# Patient Record
Sex: Male | Born: 2000 | Race: Black or African American | Hispanic: No | Marital: Single | Smoking: Never smoker
Health system: Southern US, Community
[De-identification: ages and names within clinical notes are randomized; demographics above are authoritative.]

## PROBLEM LIST (undated history)

## (undated) DIAGNOSIS — J45909 Unspecified asthma, uncomplicated: Secondary | ICD-10-CM

## (undated) HISTORY — DX: Unspecified asthma, uncomplicated: J45.909

---
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
## 2018-01-30 ENCOUNTER — Ambulatory Visit: Payer: Medicaid Other | Admitting: Pediatrics

## 2018-07-17 ENCOUNTER — Telehealth: Payer: Self-pay | Admitting: Pediatrics

## 2018-07-17 NOTE — Telephone Encounter (Signed)
Left VM at the primary number in the chart regarding prescreening questions. ° °

## 2018-07-18 ENCOUNTER — Ambulatory Visit (INDEPENDENT_AMBULATORY_CARE_PROVIDER_SITE_OTHER): Payer: Medicaid Other | Admitting: Pediatrics

## 2018-07-18 ENCOUNTER — Other Ambulatory Visit: Payer: Self-pay

## 2018-07-18 ENCOUNTER — Encounter: Payer: Self-pay | Admitting: Pediatrics

## 2018-07-18 VITALS — BP 118/78 | HR 78 | Ht 70.75 in | Wt 315.2 lb

## 2018-07-18 DIAGNOSIS — Z0001 Encounter for general adult medical examination with abnormal findings: Secondary | ICD-10-CM | POA: Diagnosis not present

## 2018-07-18 DIAGNOSIS — F902 Attention-deficit hyperactivity disorder, combined type: Secondary | ICD-10-CM

## 2018-07-18 DIAGNOSIS — Z113 Encounter for screening for infections with a predominantly sexual mode of transmission: Secondary | ICD-10-CM

## 2018-07-18 DIAGNOSIS — Z68.41 Body mass index (BMI) pediatric, greater than or equal to 95th percentile for age: Secondary | ICD-10-CM

## 2018-07-18 MED ORDER — VYVANSE 70 MG PO CAPS: 70.0000 mg | ORAL_CAPSULE | Freq: Every day | ORAL | 0 refills | Status: DC

## 2018-07-18 NOTE — Progress Notes (Signed)
Subjective:     Gary Shields, is a 18 y.o. male  HPI  Chief Complaint  Patient presents with  . Follow-up    ADHD   Also due for well exam initiated, but incomplete screening as mother arrived and reported he is late for  Dentist appt.  Requested and did not review ARS-ADHD  ROS/ Side effects? No change in appetite,--ify with mess, if not eat, eats too much when wears off No change in sleep--no proble No Headache or dizziness--only not eat breakfast , didn't eat lunch got a head ache No chest pain or palpitations--no No nausea, vomiting or abd pain No change in mood, no elation, no wearing off dysphoria--get agitated if meds wears off  Recent ADHD visits  11/2015, 12/2015, 01/2016, 08/2017 --last seen for this  03/2017-last well visit  Graduated from [employer]  To go to [employer] for 2 sem, then to [employer] in [place] and business: first a food truck, then [employer]  Working at [employer], then laid off and now food [employer] Would like to work out Dad is on a serious diet now (2 weeks) making him do it too.  Pt had gained 25 lb in last one year  Before was run and basketball--no more exercise  k and  Lives with mom, dad, 4 sisters One moved in--to start her own business  No asthma symptoms send early High school  Much less anxious and worried--figuring out the future  No drink No smoke No relationships  Too muh time on media  Wants to start on meds  Has a few pill Didn't really use them over the last school year    Grades were good; didn't pass one class--it was credit recovery Never got back in touch with him everthing else was A and B 11th grade was hardest year  No drinking, no tobacco,  Tried marijuana and didn't like that it triggered his asthma   Review of Systems   The following portions of the patient's history were reviewed and updated as appropriate: allergies, current medications, past family history, past  medical history, past social history, past surgical history and problem list.  History and Problem List: Gary Shields has Moderate persistent asthma without complication; Acanthosis nigricans; Adjustment disorder of adolescence; Obesity, pediatric, BMI 95th to 98th percentile for age; and Attention deficit hyperactivity disorder (ADHD) on their problem list.  Gary Shields  has a past medical history of Asthma.     Objective:     BP 118/78 (BP Location: Right Arm, Patient Position: Sitting, Cuff Size: Large)   Pulse 78   Ht 5' 10.75" (1.797 m)   Wt (!) 315 lb 3.2 oz (143 kg)   SpO2 99%   BMI 44.27 kg/m   Physical Exam Constitutional:      General: He is not in acute distress.    Appearance: He is well-developed.  HENT:     Head: Normocephalic and atraumatic.     Nose: Nose normal.  Eyes:     General:        Right eye: No discharge.        Left eye: No discharge.     Conjunctiva/sclera: Conjunctivae normal.  Neck:     Musculoskeletal: Normal range of motion.     Thyroid: No thyromegaly.  Cardiovascular:     Rate and Rhythm: Normal rate and regular rhythm.     Heart sounds: Normal heart sounds. No murmur.  Pulmonary:  Effort: No respiratory distress.     Breath sounds: No wheezing or rales.  Abdominal:     General: There is no distension.     Palpations: Abdomen is soft.     Tenderness: There is no abdominal tenderness.  Lymphadenopathy:     Cervical: No cervical adenopathy.  Skin:    General: Skin is warm and dry.     Findings: Rash present.     Comments: acanthosis     Assessment & Plan:   1. Encounter for general adult medical examination with abnormal findings  2. Attention deficit hyperactivity disorder (ADHD), combined type  Will need for restart before college starts To start about 7-10 days before classes in mid august  - VYVANSE 70 MG capsule; Take 1 capsule (70 mg total) by mouth daily.  Dispense: 30 capsule; Refill: 0 - VYVANSE 70 MG capsule; Take 1 capsule  (70 mg total) by mouth daily.  Dispense: 30 capsule; Refill: 0  Requested, and did not review ARS  3. Routine screening for STI (sexually transmitted infection) No collected - C. trachomatis/N. gonorrhoeae RNA - POCT Rapid HIV  4. BMI (body mass index), pediatric, > 99% for age To make appt to complete - Lipid panel - VITAMIN D 25 Hydroxy (Vit-D Deficiency, Fractures) - Hemoglobin A1c   Supportive care and return precautions reviewed.  Spent  30  minutes face to face time with patient; greater than 50% spent in counseling regarding diagnosis and treatment plan.   Theadore NanHilary Kynnadi Dicenso, MD

## 2019-02-03 ENCOUNTER — Telehealth: Payer: Self-pay | Admitting: Pediatrics

## 2019-02-03 NOTE — Telephone Encounter (Signed)

## 2019-02-04 ENCOUNTER — Encounter: Payer: Self-pay | Admitting: Pediatrics

## 2019-02-04 ENCOUNTER — Other Ambulatory Visit (HOSPITAL_COMMUNITY)
Admission: RE | Admit: 2019-02-04 | Discharge: 2019-02-04 | Disposition: A | Discharge status: Home / Self Care | Payer: Medicaid Other | Source: Ambulatory Visit | Attending: Pediatrics | Admitting: Pediatrics

## 2019-02-04 ENCOUNTER — Other Ambulatory Visit: Payer: Self-pay

## 2019-02-04 ENCOUNTER — Ambulatory Visit (INDEPENDENT_AMBULATORY_CARE_PROVIDER_SITE_OTHER): Payer: Medicaid Other | Admitting: Pediatrics

## 2019-02-04 VITALS — BP 126/78 | HR 88 | Ht 69.61 in | Wt 312.8 lb

## 2019-02-04 DIAGNOSIS — Z113 Encounter for screening for infections with a predominantly sexual mode of transmission: Secondary | ICD-10-CM

## 2019-02-04 DIAGNOSIS — Z0001 Encounter for general adult medical examination with abnormal findings: Secondary | ICD-10-CM

## 2019-02-04 DIAGNOSIS — Z0000 Encounter for general adult medical examination without abnormal findings: Secondary | ICD-10-CM | POA: Diagnosis not present

## 2019-02-04 DIAGNOSIS — F902 Attention-deficit hyperactivity disorder, combined type: Secondary | ICD-10-CM

## 2019-02-04 LAB — POCT RAPID HIV: Rapid HIV, POC: NEGATIVE

## 2019-02-04 MED ORDER — VYVANSE 70 MG PO CAPS: 70.0000 mg | ORAL_CAPSULE | Freq: Every day | ORAL | 0 refills | Status: AC

## 2019-02-04 NOTE — Patient Instructions (Signed)
  As your medical provider, it is important to me that you continue to receive high-quality primary care services as you transition to adulthood.  We can begin the process of your transition to an adult provider as early as age 21.  We would like to help this process be stream lined and smooth for you as you leave the care of our pediatric practice.   After the age of 21 (and before your 22nd birthday), you can no longer be seen at the [facility] for your primary care health services. We are working with you to assure your understanding of your healthcare needs, medications and any referrals that have be put in place for your care.    We have a list of adult medicine/family practices that you and your pediatric primary care provider will review with you to determine which practice may be the best fit for your needs      [facility]     Address: [address] [place], [place] [address] Phone: [phone]        Office Manager: Susy Manor Solis Hours: Monday - Friday 9 AM -6 PM       [facility]       Address: [address] [place], [place] [address] Phone: [phone]         Office Manager: Susy Manor Solis Hours: Monday - Friday 8:30 AM - 5 PM       [facility]    Address: [address] [place], [place] [address] Phone:  [phone]       Office Manager: Susy Manor Solis Hours:  Monday - Friday 8:30 am - 5:30 pm       [facility]                       Address: [address] [place], [place] [address]           Phone: [phone]              Office Manager:  Ree Shay                       Hours: Monday - Friday 8:30 AM - 5 PM      [facility] Located on the ground floor of [facility] Address: [address]  [place],  [place]  [address] Phone: [phone]             Office  Manager:  Gentry Fitz Hours: Monday - Friday 8:15 AM - 5 PM

## 2019-02-04 NOTE — Progress Notes (Signed)
Adolescent Well Care Visit Gary Shields is a 19 y.o. male who is here for well care and ADHD FU    PCP:  Theadore Nan, MD   History was provided by the patient.  Current Issues: Current concerns include  Last ADHD visit 07/2018  `  Last well exam 03/2017  Currently living with mom,dad and sister--29, 4 sister taking english and math at [employer] with mom Did ok for grades last semester  Didn't finish all courses/ needs better grades so is to delay start at 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 Sleeping  Sleeps 6 hr a night Goes to work at 4 am Appetite--not hungry on meds No Headache or dizziness No chest pain or palpitations No nausea, vomiting or abd pain No change in mood, no elation, no wearing off dysphoria  Obesity Not trying now to diet as he was in the fall Family to buy gym equipement Diet -dad--on and off--to restart , patient may join him Wants to restart at Y--or more exercise  Marijuana--every 2 week  Uses Edible to avoid smoke Alcohol-no Not sexually active/ not dating anyone-pandemic makes it hard  No asthma--   Dentist--last 6 months ago  Screenings: ASRS completed, positive entered results in [facility] comprephensive screening tab    Physical Exam:  Vitals:   02/04/19 1139  BP: 126/78  Pulse: 88  SpO2: 99%  Weight: (!) 312 lb 12.8 oz (141.9 kg)  Height: 5' 9.61" (1.768 m)   BP 126/78 (BP Location: Right Arm, Patient Position: Sitting)   Pulse 88   Ht 5' 9.61" (1.768 m)   Wt (!) 312 lb 12.8 oz (141.9 kg)   SpO2 99%   BMI 45.39 kg/m  Body mass index: body mass index is 45.39 kg/m. Growth percentile SmartLinks can only be used for patients less  than 75 years old.   Hearing Screening   125Hz  250Hz  500Hz  1000Hz  2000Hz  3000Hz  4000Hz  6000Hz  8000Hz   Right ear:   20 20 20  20     Left ear:   20 20 20  20       Visual Acuity Screening   Right eye Left eye Both eyes  Without correction:     With correction: 20/20 20/20 20/20   Comments: With glasses   General Appearance:   alert, oriented, no acute distress and obese  HENT: Normocephalic, no obvious abnormality, conjunctiva clear  Mouth:   Normal appearing teeth, no obvious discoloration, dental caries, or dental caps  Neck:   Supple; thyroid: no enlargement, symmetric, no tenderness/mass/nodules  Chest Normal male  Lungs:   Clear to auscultation bilaterally, normal work of breathing  Heart:   Regular rate and rhythm, S1 and S2 normal, no murmurs;   Abdomen:   Soft, non-tender, no mass, or organomegaly  GU normal male genitals, no testicular masses or hernia  Musculoskeletal:   Tone and strength strong and symmetrical, all extremities               Lymphatic:   No cervical adenopathy  Skin/Hair/Nails:   Skin warm, dry and intact,, no bruises or petechiae, acanthosis present  Neurologic:   Strength, gait, and coordination  normal and age-appropriate     Assessment and Plan:   1. Encounter for general adult medical examination with abnormal findings   2. Routine screening for STI (sexually transmitted infection)  - Urine cytology ancillary only - POCT Rapid HIV-neg  3. Class 3 severe obesity with body mass index (BMI) of 45.0 to 49.9 in adult, unspecified obesity type, unspecified whether serious comorbidity present ([place])  Somewhat motivated to restart diet improvements and exercise Screening for acanthosis and positive family hx of diabetes  - Hemoglobin A1c - Lipid panel - VITAMIN D 25 Hydroxy (Vit-D Deficiency, Fractures)  4. Attention deficit hyperactivity disorder (ADHD), combined type  Strong symptoms without medicine on ASRS Doesn't need afternoon dose  He was  disappointed in academic performance  - VYVANSE 70 MG capsule; Take 1 capsule (70 mg total) by mouth daily.  Dispense: 30 capsule; Refill: 0 - VYVANSE 70 MG capsule; Take 1 capsule (70 mg total) by mouth daily for 27 days.  Dispense: 27 capsule; Refill: 0 - VYVANSE 70 MG capsule; Take 1 capsule (70 mg total) by mouth daily.  Dispense: 30 capsule; Refill: 0   BMI is not appropriate for age  Hearing screening result:normal Vision screening result: normal   Roselind Messier, MD

## 2019-02-05 LAB — HEMOGLOBIN A1C
Hgb A1c MFr Bld: 5.5 % of total Hgb (ref <5.7)
Mean Plasma Glucose: 111 (calc)
eAG (mmol/L): 6.2 (calc)

## 2019-02-05 LAB — URINE CYTOLOGY ANCILLARY ONLY
Chlamydia: NEGATIVE
Comment: NEGATIVE
Comment: NORMAL
Neisseria Gonorrhea: NEGATIVE

## 2019-02-05 LAB — VITAMIN D 25 HYDROXY (VIT D DEFICIENCY, FRACTURES): Vit D, 25-Hydroxy: 9 ng/mL — ABNORMAL LOW (ref 30–100)

## 2019-02-05 LAB — LIPID PANEL
Cholesterol: 177 mg/dL (ref <200)
HDL: 36 mg/dL — ABNORMAL LOW (ref >=40)
LDL Cholesterol (Calc): 125 mg/dL (calc) — ABNORMAL HIGH
Non-HDL Cholesterol (Calc): 141 mg/dL (calc) — ABNORMAL HIGH (ref <130)
Total CHOL/HDL Ratio: 4.9 (calc) (ref <5.0)
Triglycerides: 68 mg/dL (ref <150)

## 2021-10-16 ENCOUNTER — Emergency Department (HOSPITAL_COMMUNITY)
Admission: EM | Admit: 2021-10-16 | Discharge: 2021-10-17 | Disposition: A | Discharge status: Home / Self Care | Payer: BLUE CROSS/BLUE SHIELD | Attending: Emergency Medicine | Admitting: Emergency Medicine

## 2021-10-16 ENCOUNTER — Encounter (HOSPITAL_COMMUNITY): Payer: Self-pay | Admitting: Emergency Medicine

## 2021-10-16 DIAGNOSIS — Z23 Encounter for immunization: Secondary | ICD-10-CM | POA: Diagnosis not present

## 2021-10-16 DIAGNOSIS — S91114A Laceration without foreign body of right lesser toe(s) without damage to nail, initial encounter: Secondary | ICD-10-CM | POA: Diagnosis not present

## 2021-10-16 DIAGNOSIS — S99921A Unspecified injury of right foot, initial encounter: Secondary | ICD-10-CM | POA: Diagnosis present

## 2021-10-16 DIAGNOSIS — W232XXA Caught, crushed, jammed or pinched between a moving and stationary object, initial encounter: Secondary | ICD-10-CM | POA: Diagnosis not present

## 2021-10-16 NOTE — ED Triage Notes (Signed)
Pt here from home with c/o lac to the underneath of he little toe on the right foot , bleeding is controlled , tdap up to date

## 2021-10-17 MED ORDER — TETANUS-DIPHTH-ACELL PERTUSSIS 5-2.5-18.5 LF-MCG/0.5 IM SUSY
0.5000 mL | PREFILLED_SYRINGE | Freq: Once | INTRAMUSCULAR | Status: AC
  Administered 2021-10-17: 0.5 mL via INTRAMUSCULAR
  Filled 2021-10-17: qty 0.5

## 2021-10-17 MED ORDER — LIDOCAINE HCL (PF) 1 % IJ SOLN
5.0000 mL | Freq: Once | INTRAMUSCULAR | Status: AC
  Administered 2021-10-17: 5 mL
  Filled 2021-10-17: qty 30

## 2021-10-17 MED ORDER — DOXYCYCLINE HYCLATE 100 MG PO CAPS: 100.0000 mg | ORAL_CAPSULE | Freq: Two times a day (BID) | ORAL | 0 refills | Status: AC

## 2021-10-17 NOTE — ED Notes (Signed)
Patient has cut on the bottom of his right foot at the small toe. Pt is unsure of last tetanus shot. Pt alert and oriented cooperative with nursing care.

## 2021-10-17 NOTE — Discharge Instructions (Addendum)
It was a pleasure taking care of you today!   You may return to urgent care or return to the emergency department for suture removal in 12-14 days.  Your tetanus was updated in the ED. Keep the area clean and dry. You will be sent an antibiotic, doxycycline, take as directed.  Return to the emergency department if worsening or persistent pain, drainage of wound, increased swelling, or color change to area.

## 2021-10-17 NOTE — ED Notes (Signed)
Pt refused discharge vitals. Rn wraped toe with nonstick dressing then gauze roll and coban. RN educated pt on care of stitches. Pt educated on prescription. Pt ambulated from ED with steady gait.

## 2021-10-17 NOTE — ED Provider Notes (Signed)
[facility]-EMERGENCY DEPT Provider Note   CSN: [id number] Arrival date & time: 10/16/21  2200     History  Chief Complaint  Patient presents with   Foot Injury    Gary Shields is Shields 21 y.o. male who presents to the ED with concerns for right foot injury onset PTA.  Patient notes that he got his right small toe caught in Shields dog cage.  He notes that he was barefoot at the time.  Denies anticoagulants at this time.  Unsure of his tetanus status.  No meds tried prior to arrival.  Denies history of diabetes.  No known drug allergies.  No fever, redness, drainage.  The history is provided by the patient. No language interpreter was used.       Home Medications Prior to Admission medications   Medication Sig Start Date End Date Taking? Authorizing Provider  doxycycline (VIBRAMYCIN) 100 MG capsule Take 1 capsule (100 mg total) by mouth 2 (two) times daily for 5 days. 10/17/21 10/22/21 Yes Gary Luviano A, PA-C  albuterol (PROVENTIL HFA;VENTOLIN HFA) 108 (90 Base) MCG/ACT inhaler Inhale 2 puffs into the lungs every 4 (four) hours as needed for wheezing or shortness of breath. 08/16/17 09/15/17  Stryffeler, Gary Jordan, NP  VYVANSE 70 MG capsule Take 1 capsule (70 mg total) by mouth daily. 04/04/19 05/05/19  Gary Nan, MD  VYVANSE 70 MG capsule Take 1 capsule (70 mg total) by mouth daily for 27 days. 03/07/19 04/03/19  Gary Nan, MD  VYVANSE 70 MG capsule Take 1 capsule (70 mg total) by mouth daily. 02/04/19 03/07/19  Gary Nan, MD      Allergies    Patient has no known allergies.    Review of Systems   Review of Systems  Constitutional:  Negative for fever.  Musculoskeletal:  Negative for arthralgias and joint swelling.  Skin:  Positive for wound. Negative for color change.  All other systems reviewed and are negative.   Physical Exam Updated Vital Signs BP (!) 157/75   Pulse 72   Temp 98.2 F (36.8 C) (Oral)   Resp 18   SpO2 97%   Physical Exam Vitals and nursing note reviewed.  Constitutional:      General: He is not in acute distress.    Appearance: Normal appearance. He is not ill-appearing.  HENT:     Head: Normocephalic and atraumatic.     Right Ear: External ear normal.     Left Ear: External ear normal.  Eyes:     General: No scleral icterus. Cardiovascular:     Rate and Rhythm: Normal rate.  Pulmonary:     Effort: Pulmonary effort is normal.  Musculoskeletal:        General: Normal range of motion.     Cervical back: Normal range of motion and neck supple.     Comments: Full flexion and extension against resistance of right fifth toe.  Pedal pulses intact bilaterally.  Skin:    General: Skin is warm and dry.     Capillary Refill: Capillary refill takes less than 2 seconds.     Findings: Laceration present.     Comments: 3 cm laceration noted to plantar aspect of right fifth toe at the base.   Neurological:     Mental Status: He is alert.     ED Results / Procedures / Treatments   Labs (all labs ordered are listed, but only abnormal results are displayed) Labs Reviewed - No data to display  EKG None  Radiology No results found.  Procedures .Marland KitchenLaceration Repair  Date/Time: 10/17/2021 6:51 AM  Performed by: Gary Colder A, PA-C Authorized by: Gary Settle, PA-C   Consent:    Consent obtained:  Verbal   Consent given by:  Patient   Risks discussed:  Infection, pain and need for additional repair Universal protocol:    Patient identity confirmed:  Verbally with patient and hospital-assigned identification number Anesthesia:    Anesthesia method:  Local infiltration   Local anesthetic:  Lidocaine 1% w/o epi Laceration details:    Location:  Foot   Foot location:  Sole of R foot   Length (cm):  3 Pre-procedure details:    Preparation:  Patient was prepped and draped in usual sterile fashion Exploration:    Limited defect created (wound extended): no     Hemostasis achieved  with:  Direct pressure   Imaging outcome: foreign body not noted     Wound exploration: entire depth of wound visualized   Treatment:    Area cleansed with:  Povidone-iodine   Amount of cleaning:  Standard   Irrigation solution:  Sterile saline   Irrigation method:  Syringe Skin repair:    Repair method:  Sutures   Suture size:  4-0   Suture material:  Prolene   Suture technique:  Simple interrupted   Number of sutures:  6 Approximation:    Approximation:  Close Repair type:    Repair type:  Simple Post-procedure details:    Dressing:  Non-adherent dressing and sterile dressing   Procedure completion:  Tolerated well, no immediate complications     Medications Ordered in ED Medications  lidocaine (PF) (XYLOCAINE) 1 % injection 5 mL (5 mLs Infiltration Given 10/17/21 0500)  Tdap (BOOSTRIX) injection 0.5 mL (0.5 mLs Intramuscular Given 10/17/21 3244)    ED Course/ Medical Decision Making/ Shields&P                           Medical Decision Making Risk Prescription drug management.   Patient presents with laceration noted to the right small toe prior to arrival. Pt is not on anticoagulants at this time. Vital signs, patient afebrile. On exam, patient with 5 cm laceration noted to plantar aspect of right fifth toe at the base. Full flexion and extension against resistance of right fifth toe.  Pedal pulses intact bilaterally. Tetanus updated in the ED. Laceration occurred < 12 hours prior to repair. Differential diagnosis includes, fracture, foreign body, dislocation, avulsion.   Medications:  I ordered medication including tetanus vaccination  I have reviewed the patients home medicines and have made adjustments as needed  Disposition: Presenting suspicious for laceration. Doubt fracture, dislocation, or foreign body at this time. Tetanus updated in the ED. Wound thoroughly irrigated, no foreign bodies noted. Laceration repaired in the ED today. After consideration of the diagnostic  results and the patients response to treatment, I feel that the patient would benefit from Discharge home. Discussed laceration care with pt and answered questions. Pt to follow up for suture/staple removal in 12-14 days and wound check sooner should there be signs of dehiscence or infection.  Doxycycline sent today.  Pt is hemodynamically stable with no complaints prior to discharge. Supportive care measures and strict return precautions discussed with patient at bedside. Pt acknowledges and verbalizes understanding. Pt appears safe for discharge. Follow up as indicated in discharge paperwork.    This chart was dictated using voice recognition software, Dragon.  Despite the best efforts of this provider to proofread and correct errors, errors may still occur which can change documentation meaning.   Final Clinical Impression(s) / ED Diagnoses Final diagnoses:  Laceration of fifth toe of right foot, initial encounter    Rx / DC Orders ED Discharge Orders          Ordered    doxycycline (VIBRAMYCIN) 100 MG capsule  2 times daily        10/17/21 0633              Odalis Shields A, PA-C 10/17/21 4462    Geoffery Lyons, MD 10/17/21 [phone]

## 2022-01-26 DIAGNOSIS — Z111 Encounter for screening for respiratory tuberculosis: Secondary | ICD-10-CM | POA: Diagnosis not present

## 2022-01-29 DIAGNOSIS — Z0279 Encounter for issue of other medical certificate: Secondary | ICD-10-CM | POA: Diagnosis not present
